# Patient Record
Sex: Female | Born: 1950 | Race: Black or African American | Hispanic: No | Marital: Married | Smoking: Never smoker
Health system: Southern US, Community
[De-identification: ages and names within clinical notes are randomized; demographics above are authoritative.]

---

## 2003-03-14 ENCOUNTER — Encounter: Payer: Self-pay | Admitting: Emergency Medicine

## 2003-03-14 ENCOUNTER — Emergency Department (HOSPITAL_COMMUNITY): Admission: EM | Admit: 2003-03-14 | Discharge: 2003-03-14 | Payer: Self-pay | Admitting: Emergency Medicine

## 2003-03-25 ENCOUNTER — Encounter: Payer: Self-pay | Admitting: Orthopedic Surgery

## 2003-03-25 ENCOUNTER — Encounter: Admission: RE | Admit: 2003-03-25 | Discharge: 2003-03-25 | Payer: Self-pay | Admitting: Orthopedic Surgery

## 2003-06-19 ENCOUNTER — Encounter: Admission: RE | Admit: 2003-06-19 | Discharge: 2003-06-19 | Payer: Self-pay | Admitting: Orthopedic Surgery

## 2005-08-12 ENCOUNTER — Encounter: Admission: RE | Admit: 2005-08-12 | Discharge: 2005-08-12 | Payer: Self-pay | Admitting: Obstetrics and Gynecology

## 2006-05-02 ENCOUNTER — Emergency Department (HOSPITAL_COMMUNITY): Admission: EM | Admit: 2006-05-02 | Discharge: 2006-05-02 | Payer: Self-pay | Admitting: Emergency Medicine

## 2007-02-28 ENCOUNTER — Observation Stay (HOSPITAL_COMMUNITY): Admission: EM | Admit: 2007-02-28 | Discharge: 2007-03-01 | Payer: Self-pay | Admitting: Emergency Medicine

## 2009-01-22 ENCOUNTER — Encounter: Admission: RE | Admit: 2009-01-22 | Discharge: 2009-01-22 | Payer: Self-pay | Admitting: Obstetrics and Gynecology

## 2010-07-13 ENCOUNTER — Encounter: Payer: Self-pay | Admitting: Obstetrics and Gynecology

## 2010-11-03 NOTE — H&P (Signed)
Valerie Miller, Valerie Miller                ACCOUNT NO.:  [id number]   MEDICAL RECORD NO.:  [id number]          PATIENT TYPE:  EMS   LOCATION:  MAJO                         FACILITY:  [facility]   PHYSICIAN:  Michelene Gardener, MD    DATE OF BIRTH:  [birth year]   DATE OF ADMISSION:  02/28/2007  DATE OF DISCHARGE:                              HISTORY & PHYSICAL   PRIMARY PHYSICIAN:  Deirdre Peer. Polite, M.D.   CHIEF COMPLAINT:  Chest pressure.   HISTORY OF PRESENT ILLNESS:  This is a 60 year old African American  female with no significant past medical history, who presented with the  above-mentioned complaints.  The patient saw her doctor, Dr. Renford Dills, on September 5 for a check-up.  At that time she was also  complaining of some palpitations and fatigability.  EKG was done by Dr.  Nehemiah Settle at that time and it showed some abnormalities and the patient was  recommended and scheduled for a stress test on September 10.  Starting  last Sunday she started feeling more fatigued with loss of energy and  she had chest pressure on and off.  Yesterday she was feeling very  fatigued.  Today the chest pressure has become more frequent and she  became more fatigued, so she decided to come to the ER.  Described her  pressure as pressure-like, 2 out of 3, radiating to her back, and there  is no radiation to the neck, no radiation to her shoulder or left arm.  Had some nausea but there is no vomiting and she had some sweating,  which she thinks might be just hot flashes.  In the ER her EKG showed  some abnormalities and the hospitalist service was called for further  evaluation.   PAST MEDICAL HISTORY:  1. Arthritis.  2. Chronic back pain.   PAST SURGICAL HISTORY:  Back surgery.   ALLERGIES:  CLINDAMYCIN, ERYTHROMYCIN, KEFLEX, LEVAQUIN, OXYCODONE,  VICODIN.   CURRENT MEDICATIONS:  1. Aspirin 81 mg p.o. once daily.  2. Multivitamin one tablet p.o. once daily.  3. Vitamin D 1000 mg p.o. once daily.  4.  Calcium twice daily.  5. Atenolol that was prescribed by her primary care physician the      patient was never started on the atenolol.   SOCIAL HISTORY:  Denies smoking.  She only tried smoking while she was  in college but not smoking.  Drinks alcohol occasionally.  No history of  recreational drugs.   FAMILY HISTORY:  The patient is adopted and does not know too much about  her family history.   REVIEW OF SYSTEMS:  As per HPI.   PHYSICAL EXAMINATION:  VITAL SIGNS:  Temperature is 97.2, blood pressure  is 127/66, pulse 94, respiratory rate 16.  GENERAL APPEARANCE:  This is a middle-aged [place] American female in no  acute distress.  HEENT:  Conjunctivae showed no pallor, no erythema.  Pupils equal,  round, and reactive to light and accommodation.  There is no ptosis.  Hearing is intact.  There is no ear discharge, no infection.  There is  no mouth  infection or bleeding.  Oral mucosa is dry.  No pharyngeal  erythema.  NECK:  Supple.  No JVD.  No carotid bruit.  No lymphadenopathy.  No  thyroid enlargement or thyroid tenderness.  CARDIOVASCULAR:  S1 and S2 are regular.  There are no murmurs, no  gallops, and no thrills.  RESPIRATORY:  The patient is breathing between 16 to 18.  There is no  use of accessory muscles, and there are no rales, no rhonchi and no  wheezes noted.  ABDOMEN:  Soft, nondistended, nontender, no hepatosplenomegaly.  Bowel  sounds are normal.  The umbilicus is central.  LOWER EXTREMITIES:  No edema, no rashes, no varicose veins.  SKIN:  No rash and no erythema.  NEUROLOGIC:  Cranial nerves intact II-XII.   LAB RESULTS:  Sodium 137, potassium 4.5, chloride 106, bicarb 26,  glucose 102, BUN 13, creatinine is 0.9.  WBC 4.4, hemoglobin 13.7,  hematocrit 39.8, MCV 83.5.  EKG was not available at this time but as  per the ER attending, there is some nonspecific T-wave inversion.  We  will try to locate the EKG.   IMPRESSION AND ASSESSMENT:  1. Chest pain.   Will admit this patient to telemetry.  I will get      three sets of troponin and cardiac enzymes and I will follow serial      EKGs.  Will start this patient on aspirin and metoprolol and      nitroglycerin as needed.  I will also start her on Lovenox.  Will      get a cardiology evaluation.  This patient actually is supposed to      get a stress test tomorrow so we will get evaluation in the      hospital to proceed with stress test.  2. Chronic back pain.  Will continue ibuprofen as needed.   TOTAL ASSESSMENT TIME:  45 minutes.      Michelene Gardener, MD  Electronically Signed     NAE/MEDQ  D:  02/28/2007  T:  02/28/2007  Job:  [id number]   cc:   Deirdre Peer. Polite, M.D.

## 2010-11-03 NOTE — H&P (Signed)
NAMELATONDRA, Valerie Miller                ACCOUNT NO.:  [id number]   MEDICAL RECORD NO.:  [id number]          PATIENT TYPE:  INP   LOCATION:  1824                         FACILITY:  [facility]   PHYSICIAN:  Michelene Gardener, MD    DATE OF BIRTH:  1950-12-22   DATE OF ADMISSION:  02/28/2007  DATE OF DISCHARGE:                              HISTORY & PHYSICAL   Primary physician is   Sorry, this is a wrong dictation.  Thank you.      Michelene Gardener, MD  Electronically Signed     NAE/MEDQ  D:  02/28/2007  T:  02/28/2007  Job:  [phone]

## 2010-11-26 ENCOUNTER — Other Ambulatory Visit: Payer: Self-pay | Admitting: Obstetrics and Gynecology

## 2010-11-26 ENCOUNTER — Other Ambulatory Visit (HOSPITAL_COMMUNITY)
Admission: RE | Admit: 2010-11-26 | Discharge: 2010-11-26 | Disposition: A | Discharge status: Home / Self Care | Payer: BC Managed Care – PPO | Source: Ambulatory Visit | Attending: Obstetrics and Gynecology | Admitting: Obstetrics and Gynecology

## 2010-11-26 DIAGNOSIS — Z01419 Encounter for gynecological examination (general) (routine) without abnormal findings: Secondary | ICD-10-CM | POA: Insufficient documentation

## 2010-11-26 DIAGNOSIS — Z1231 Encounter for screening mammogram for malignant neoplasm of breast: Secondary | ICD-10-CM

## 2010-12-04 ENCOUNTER — Ambulatory Visit
Admission: RE | Admit: 2010-12-04 | Discharge: 2010-12-04 | Disposition: A | Discharge status: Home / Self Care | Payer: BC Managed Care – PPO | Source: Ambulatory Visit | Attending: Obstetrics and Gynecology | Admitting: Obstetrics and Gynecology

## 2010-12-04 DIAGNOSIS — Z1231 Encounter for screening mammogram for malignant neoplasm of breast: Secondary | ICD-10-CM

## 2011-04-02 LAB — CBC
HCT: 39.8
Platelets: 261
WBC: 4.4

## 2011-04-02 LAB — TSH: TSH: 0.649

## 2011-04-02 LAB — I-STAT 8, (EC8 V) (CONVERTED LAB)
Acid-Base Excess: 1
BUN: 13
Chloride: 106
HCT: 42
Hemoglobin: 14.3
Operator id: 234501
Potassium: 4.5
Sodium: 137

## 2011-04-02 LAB — D-DIMER, QUANTITATIVE: D-Dimer, Quant: 0.35

## 2011-04-02 LAB — CARDIAC PANEL(CRET KIN+CKTOT+MB+TROPI)
CK, MB: 1.9
Relative Index: 1.2
Troponin I: 0.01

## 2011-04-02 LAB — CK TOTAL AND CKMB (NOT AT ARMC)
CK, MB: 3.3
Relative Index: 1.6

## 2011-04-02 LAB — DIFFERENTIAL
Eosinophils Relative: 2
Lymphocytes Relative: 26
Lymphs Abs: 1.2
Neutro Abs: 2.9
Neutrophils Relative %: 65

## 2011-04-02 LAB — POCT I-STAT CREATININE
Creatinine, Ser: 0.9
Operator id: 234501

## 2014-03-22 ENCOUNTER — Other Ambulatory Visit (HOSPITAL_COMMUNITY)
Admission: RE | Admit: 2014-03-22 | Discharge: 2014-03-22 | Disposition: A | Discharge status: Home / Self Care | Payer: PRIVATE HEALTH INSURANCE | Source: Ambulatory Visit | Attending: Obstetrics and Gynecology | Admitting: Obstetrics and Gynecology

## 2014-03-22 ENCOUNTER — Other Ambulatory Visit: Payer: Self-pay | Admitting: Obstetrics and Gynecology

## 2014-03-22 DIAGNOSIS — Z1151 Encounter for screening for human papillomavirus (HPV): Secondary | ICD-10-CM | POA: Insufficient documentation

## 2014-03-22 DIAGNOSIS — Z01419 Encounter for gynecological examination (general) (routine) without abnormal findings: Secondary | ICD-10-CM | POA: Diagnosis not present

## 2014-03-25 LAB — CYTOLOGY - PAP

## 2014-05-22 ENCOUNTER — Encounter (HOSPITAL_COMMUNITY): Payer: Self-pay | Admitting: Emergency Medicine

## 2014-05-22 ENCOUNTER — Emergency Department (INDEPENDENT_AMBULATORY_CARE_PROVIDER_SITE_OTHER)
Admission: EM | Admit: 2014-05-22 | Discharge: 2014-05-22 | Disposition: A | Discharge status: Home / Self Care | Payer: PRIVATE HEALTH INSURANCE | Source: Home / Self Care | Attending: Emergency Medicine | Admitting: Emergency Medicine

## 2014-05-22 DIAGNOSIS — R0982 Postnasal drip: Secondary | ICD-10-CM

## 2014-05-22 DIAGNOSIS — R05 Cough: Secondary | ICD-10-CM

## 2014-05-22 DIAGNOSIS — R059 Cough, unspecified: Secondary | ICD-10-CM

## 2014-05-22 DIAGNOSIS — J069 Acute upper respiratory infection, unspecified: Secondary | ICD-10-CM

## 2014-05-22 NOTE — ED Provider Notes (Signed)
CSN: [id number]     Arrival date & time 05/22/14  5852 History   First MD Initiated Contact with Patient 05/22/14 [phone]     Chief Complaint  Patient presents with  . URI   (Consider location/radiation/quality/duration/timing/severity/associated sxs/prior Treatment) HPI Comments: URI symptoms for approximately 3 weeks. She was given a prescription for Ceftinir earlier this month for her sinus congestion. She completed that course. She is complaining primarily of persistent cough and PND. She denies fever or shortness of breath.   History reviewed. No pertinent past medical history. History reviewed. No pertinent past surgical history. No family history on file. History  Substance Use Topics  . Smoking status: Never Smoker   . Smokeless tobacco: Not on file  . Alcohol Use: No   OB History    No data available     Review of Systems  Constitutional: Positive for fatigue. Negative for fever, chills, activity change and appetite change.  HENT: Positive for congestion, postnasal drip and rhinorrhea. Negative for facial swelling and sore throat.   Eyes: Negative.   Respiratory: Positive for cough. Negative for shortness of breath.   Cardiovascular: Negative.   Musculoskeletal: Negative for neck pain and neck stiffness.  Skin: Negative for pallor and rash.  Neurological: Negative.     Allergies  Erythromycin and Levaquin  Home Medications   Prior to Admission medications   Not on File   BP 102/71 mmHg  Pulse 75  Temp(Src) 97.3 F (36.3 C) (Oral)  Resp 18  SpO2 96% Physical Exam  Constitutional: She is oriented to person, place, and time. She appears well-developed and well-nourished. No distress.  HENT:  Mouth/Throat: No oropharyngeal exudate.  Bilateral TMs are normal Oropharynx with minor erythema and scant clear PND. Clearing throat frequently  Eyes: Conjunctivae and EOM are normal.  Neck: Normal range of motion. Neck supple.  Cardiovascular: Normal rate, regular  rhythm and normal heart sounds.   Pulmonary/Chest: Effort normal and breath sounds normal. No respiratory distress. She has no wheezes. She has no rales.  Lymphadenopathy:    She has no cervical adenopathy.  Neurological: She is alert and oriented to person, place, and time.  Skin: Skin is warm and dry.  Psychiatric: She has a normal mood and affect.  Nursing note and vitals reviewed.   ED Course  Procedures (including critical care time) Labs Review Labs Reviewed - No data to display  Imaging Review No results found.   MDM   1. URI (upper respiratory infection)   2. PND (post-nasal drip)   3. Cough    Allegra 180 mg a day And , Sudafed PE 10 mg every 4 hours prn congestion Stop the claritin D Lots of fluids    Janne Napoleon, NP 05/22/14 [phone]

## 2014-05-22 NOTE — ED Notes (Signed)
C/o cold sx onset 3 weeks Sx include productive cough, wheezing, HA, facial pressure, PND Denies fevers, chills, v/n/d Reports she finished Cefdinir for Sinus inf on 11/28 Alert, no signs of acute distress.

## 2014-05-22 NOTE — Discharge Instructions (Signed)
Cough, Adult Allegra 180 mg a day And , Sudafed PE 10 mg every 4 hours prn congestion  A cough is a reflex that helps clear your throat and airways. It can help heal the body or may be a reaction to an irritated airway. A cough may only last 2 or 3 weeks (acute) or may last more than 8 weeks (chronic).  CAUSES Acute cough:  Viral or bacterial infections. Chronic cough:  Infections.  Allergies.  Asthma.  Post-nasal drip.  Smoking.  Heartburn or acid reflux.  Some medicines.  Chronic lung problems (COPD).  Cancer. SYMPTOMS   Cough.  Fever.  Chest pain.  Increased breathing rate.  High-pitched whistling sound when breathing (wheezing).  Colored mucus that you cough up (sputum). TREATMENT   A bacterial cough may be treated with antibiotic medicine.  A viral cough must run its course and will not respond to antibiotics.  Your caregiver may recommend other treatments if you have a chronic cough. HOME CARE INSTRUCTIONS   Only take over-the-counter or prescription medicines for pain, discomfort, or fever as directed by your caregiver. Use cough suppressants only as directed by your caregiver.  Use a cold steam vaporizer or humidifier in your bedroom or home to help loosen secretions.  Sleep in a semi-upright position if your cough is worse at night.  Rest as needed.  Stop smoking if you smoke. SEEK IMMEDIATE MEDICAL CARE IF:   You have pus in your sputum.  Your cough starts to worsen.  You cannot control your cough with suppressants and are losing sleep.  You begin coughing up blood.  You have difficulty breathing.  You develop pain which is getting worse or is uncontrolled with medicine.  You have a fever. MAKE SURE YOU:   Understand these instructions.  Will watch your condition.  Will get help right away if you are not doing well or get worse. Document Released: 12/04/2010 Document Revised: 08/30/2011 Document Reviewed: 12/04/2010 [facility]  Patient Information 2015 [place], [place]. This information is not intended to replace advice given to you by your health care provider. Make sure you discuss any questions you have with your health care provider.  Upper Respiratory Infection, Adult An upper respiratory infection (URI) is also sometimes known as the common cold. The upper respiratory tract includes the nose, sinuses, throat, trachea, and bronchi. Bronchi are the airways leading to the lungs. Most people improve within 1 week, but symptoms can last up to 2 weeks. A residual cough may last even longer.  CAUSES Many different viruses can infect the tissues lining the upper respiratory tract. The tissues become irritated and inflamed and often become very moist. Mucus production is also common. A cold is contagious. You can easily spread the virus to others by oral contact. This includes kissing, sharing a glass, coughing, or sneezing. Touching your mouth or nose and then touching a surface, which is then touched by another person, can also spread the virus. SYMPTOMS  Symptoms typically develop 1 to 3 days after you come in contact with a cold virus. Symptoms vary from person to person. They may include:  Runny nose.  Sneezing.  Nasal congestion.  Sinus irritation.  Sore throat.  Loss of voice (laryngitis).  Cough.  Fatigue.  Muscle aches.  Loss of appetite.  Headache.  Low-grade fever. DIAGNOSIS  You might diagnose your own cold based on familiar symptoms, since most people get a cold 2 to 3 times a year. Your caregiver can confirm this based on your  exam. Most importantly, your caregiver can check that your symptoms are not due to another disease such as strep throat, sinusitis, pneumonia, asthma, or epiglottitis. Blood tests, throat tests, and X-rays are not necessary to diagnose a common cold, but they may sometimes be helpful in excluding other more serious diseases. Your caregiver will decide if any further tests are  required. RISKS AND COMPLICATIONS  You may be at risk for a more severe case of the common cold if you smoke cigarettes, have chronic heart disease (such as heart failure) or lung disease (such as asthma), or if you have a weakened immune system. The very young and very old are also at risk for more serious infections. Bacterial sinusitis, middle ear infections, and bacterial pneumonia can complicate the common cold. The common cold can worsen asthma and chronic obstructive pulmonary disease (COPD). Sometimes, these complications can require emergency medical care and may be life-threatening. PREVENTION  The best way to protect against getting a cold is to practice good hygiene. Avoid oral or hand contact with people with cold symptoms. Wash your hands often if contact occurs. There is no clear evidence that vitamin C, vitamin E, echinacea, or exercise reduces the chance of developing a cold. However, it is always recommended to get plenty of rest and practice good nutrition. TREATMENT  Treatment is directed at relieving symptoms. There is no cure. Antibiotics are not effective, because the infection is caused by a virus, not by bacteria. Treatment may include:  Increased fluid intake. Sports drinks offer valuable electrolytes, sugars, and fluids.  Breathing heated mist or steam (vaporizer or shower).  Eating chicken soup or other clear broths, and maintaining good nutrition.  Getting plenty of rest.  Using gargles or lozenges for comfort.  Controlling fevers with ibuprofen or acetaminophen as directed by your caregiver.  Increasing usage of your inhaler if you have asthma. Zinc gel and zinc lozenges, taken in the first 24 hours of the common cold, can shorten the duration and lessen the severity of symptoms. Pain medicines may help with fever, muscle aches, and throat pain. A variety of non-prescription medicines are available to treat congestion and runny nose. Your caregiver can make  recommendations and may suggest nasal or lung inhalers for other symptoms.  HOME CARE INSTRUCTIONS   Only take over-the-counter or prescription medicines for pain, discomfort, or fever as directed by your caregiver.  Use a warm mist humidifier or inhale steam from a shower to increase air moisture. This may keep secretions moist and make it easier to breathe.  Drink enough water and fluids to keep your urine clear or pale yellow.  Rest as needed.  Return to work when your temperature has returned to normal or as your caregiver advises. You may need to stay home longer to avoid infecting others. You can also use a face mask and careful hand washing to prevent spread of the virus. SEEK MEDICAL CARE IF:   After the first few days, you feel you are getting worse rather than better.  You need your caregiver's advice about medicines to control symptoms.  You develop chills, worsening shortness of breath, or brown or red sputum. These may be signs of pneumonia.  You develop yellow or brown nasal discharge or pain in the face, especially when you bend forward. These may be signs of sinusitis.  You develop a fever, swollen neck glands, pain with swallowing, or white areas in the back of your throat. These may be signs of strep throat. SEEK IMMEDIATE  MEDICAL CARE IF:   You have a fever.  You develop severe or persistent headache, ear pain, sinus pain, or chest pain.  You develop wheezing, a prolonged cough, cough up blood, or have a change in your usual mucus (if you have chronic lung disease).  You develop sore muscles or a stiff neck. Document Released: 12/01/2000 Document Revised: 08/30/2011 Document Reviewed: 09/12/2013 [facility] Patient Information 2015 [place], [place]. This information is not intended to replace advice given to you by your health care provider. Make sure you discuss any questions you have with your health care provider.

## 2016-06-29 DIAGNOSIS — N952 Postmenopausal atrophic vaginitis: Secondary | ICD-10-CM | POA: Diagnosis not present

## 2016-06-29 DIAGNOSIS — N941 Unspecified dyspareunia: Secondary | ICD-10-CM | POA: Diagnosis not present

## 2016-06-29 DIAGNOSIS — Z113 Encounter for screening for infections with a predominantly sexual mode of transmission: Secondary | ICD-10-CM | POA: Diagnosis not present

## 2016-06-29 DIAGNOSIS — Z01419 Encounter for gynecological examination (general) (routine) without abnormal findings: Secondary | ICD-10-CM | POA: Diagnosis not present

## 2016-06-29 DIAGNOSIS — Z124 Encounter for screening for malignant neoplasm of cervix: Secondary | ICD-10-CM | POA: Diagnosis not present

## 2016-10-29 DIAGNOSIS — J309 Allergic rhinitis, unspecified: Secondary | ICD-10-CM | POA: Diagnosis not present

## 2016-10-29 DIAGNOSIS — E559 Vitamin D deficiency, unspecified: Secondary | ICD-10-CM | POA: Diagnosis not present

## 2016-10-29 DIAGNOSIS — Z0000 Encounter for general adult medical examination without abnormal findings: Secondary | ICD-10-CM | POA: Diagnosis not present

## 2016-11-19 DIAGNOSIS — J019 Acute sinusitis, unspecified: Secondary | ICD-10-CM | POA: Diagnosis not present

## 2016-12-28 DIAGNOSIS — Z1211 Encounter for screening for malignant neoplasm of colon: Secondary | ICD-10-CM | POA: Diagnosis not present

## 2017-02-02 DIAGNOSIS — J019 Acute sinusitis, unspecified: Secondary | ICD-10-CM | POA: Diagnosis not present

## 2017-02-02 DIAGNOSIS — N760 Acute vaginitis: Secondary | ICD-10-CM | POA: Diagnosis not present

## 2017-02-14 DIAGNOSIS — R05 Cough: Secondary | ICD-10-CM | POA: Diagnosis not present

## 2017-02-14 DIAGNOSIS — N760 Acute vaginitis: Secondary | ICD-10-CM | POA: Diagnosis not present

## 2017-02-14 DIAGNOSIS — R030 Elevated blood-pressure reading, without diagnosis of hypertension: Secondary | ICD-10-CM | POA: Diagnosis not present

## 2017-02-14 DIAGNOSIS — J209 Acute bronchitis, unspecified: Secondary | ICD-10-CM | POA: Diagnosis not present

## 2017-04-04 DIAGNOSIS — M26623 Arthralgia of bilateral temporomandibular joint: Secondary | ICD-10-CM | POA: Diagnosis not present

## 2017-04-04 DIAGNOSIS — K219 Gastro-esophageal reflux disease without esophagitis: Secondary | ICD-10-CM | POA: Diagnosis not present

## 2017-04-04 DIAGNOSIS — J329 Chronic sinusitis, unspecified: Secondary | ICD-10-CM | POA: Diagnosis not present

## 2017-06-06 DIAGNOSIS — N760 Acute vaginitis: Secondary | ICD-10-CM | POA: Diagnosis not present

## 2017-06-06 DIAGNOSIS — J019 Acute sinusitis, unspecified: Secondary | ICD-10-CM | POA: Diagnosis not present

## 2017-06-25 DIAGNOSIS — R030 Elevated blood-pressure reading, without diagnosis of hypertension: Secondary | ICD-10-CM | POA: Diagnosis not present

## 2017-06-25 DIAGNOSIS — J019 Acute sinusitis, unspecified: Secondary | ICD-10-CM | POA: Diagnosis not present

## 2017-10-10 DIAGNOSIS — H35363 Drusen (degenerative) of macula, bilateral: Secondary | ICD-10-CM | POA: Diagnosis not present

## 2017-10-10 DIAGNOSIS — H25013 Cortical age-related cataract, bilateral: Secondary | ICD-10-CM | POA: Diagnosis not present

## 2017-10-10 DIAGNOSIS — H40013 Open angle with borderline findings, low risk, bilateral: Secondary | ICD-10-CM | POA: Diagnosis not present

## 2017-10-10 DIAGNOSIS — H2513 Age-related nuclear cataract, bilateral: Secondary | ICD-10-CM | POA: Diagnosis not present

## 2017-12-01 DIAGNOSIS — Z1389 Encounter for screening for other disorder: Secondary | ICD-10-CM | POA: Diagnosis not present

## 2017-12-01 DIAGNOSIS — J329 Chronic sinusitis, unspecified: Secondary | ICD-10-CM | POA: Diagnosis not present

## 2017-12-01 DIAGNOSIS — N6321 Unspecified lump in the left breast, upper outer quadrant: Secondary | ICD-10-CM | POA: Diagnosis not present

## 2017-12-01 DIAGNOSIS — R5383 Other fatigue: Secondary | ICD-10-CM | POA: Diagnosis not present

## 2017-12-01 DIAGNOSIS — R7989 Other specified abnormal findings of blood chemistry: Secondary | ICD-10-CM | POA: Diagnosis not present

## 2017-12-01 DIAGNOSIS — M545 Low back pain: Secondary | ICD-10-CM | POA: Diagnosis not present

## 2017-12-01 DIAGNOSIS — E559 Vitamin D deficiency, unspecified: Secondary | ICD-10-CM | POA: Diagnosis not present

## 2017-12-01 DIAGNOSIS — Z0000 Encounter for general adult medical examination without abnormal findings: Secondary | ICD-10-CM | POA: Diagnosis not present

## 2017-12-02 DIAGNOSIS — R922 Inconclusive mammogram: Secondary | ICD-10-CM | POA: Diagnosis not present

## 2017-12-02 DIAGNOSIS — N6012 Diffuse cystic mastopathy of left breast: Secondary | ICD-10-CM | POA: Diagnosis not present

## 2017-12-07 ENCOUNTER — Other Ambulatory Visit: Payer: Self-pay | Admitting: Radiology

## 2017-12-07 DIAGNOSIS — D0592 Unspecified type of carcinoma in situ of left breast: Secondary | ICD-10-CM | POA: Diagnosis not present

## 2017-12-07 DIAGNOSIS — C50912 Malignant neoplasm of unspecified site of left female breast: Secondary | ICD-10-CM | POA: Diagnosis not present

## 2017-12-07 DIAGNOSIS — D592 Drug-induced nonautoimmune hemolytic anemia: Secondary | ICD-10-CM | POA: Diagnosis not present

## 2017-12-07 DIAGNOSIS — N6322 Unspecified lump in the left breast, upper inner quadrant: Secondary | ICD-10-CM | POA: Diagnosis not present

## 2017-12-07 DIAGNOSIS — C50212 Malignant neoplasm of upper-inner quadrant of left female breast: Secondary | ICD-10-CM | POA: Diagnosis not present

## 2017-12-20 DIAGNOSIS — N6322 Unspecified lump in the left breast, upper inner quadrant: Secondary | ICD-10-CM | POA: Diagnosis not present

## 2017-12-21 DIAGNOSIS — C50912 Malignant neoplasm of unspecified site of left female breast: Secondary | ICD-10-CM | POA: Diagnosis not present

## 2017-12-21 DIAGNOSIS — Z6827 Body mass index (BMI) 27.0-27.9, adult: Secondary | ICD-10-CM | POA: Diagnosis not present

## 2017-12-21 DIAGNOSIS — C50212 Malignant neoplasm of upper-inner quadrant of left female breast: Secondary | ICD-10-CM | POA: Diagnosis not present

## 2017-12-21 DIAGNOSIS — Z170 Estrogen receptor positive status [ER+]: Secondary | ICD-10-CM | POA: Diagnosis not present

## 2018-01-04 DIAGNOSIS — C50212 Malignant neoplasm of upper-inner quadrant of left female breast: Secondary | ICD-10-CM | POA: Diagnosis not present

## 2018-01-04 DIAGNOSIS — Z170 Estrogen receptor positive status [ER+]: Secondary | ICD-10-CM | POA: Diagnosis not present

## 2018-01-05 DIAGNOSIS — Z87891 Personal history of nicotine dependence: Secondary | ICD-10-CM | POA: Diagnosis not present

## 2018-01-05 DIAGNOSIS — Z170 Estrogen receptor positive status [ER+]: Secondary | ICD-10-CM | POA: Diagnosis not present

## 2018-01-05 DIAGNOSIS — C50912 Malignant neoplasm of unspecified site of left female breast: Secondary | ICD-10-CM | POA: Diagnosis not present

## 2018-01-05 DIAGNOSIS — C50212 Malignant neoplasm of upper-inner quadrant of left female breast: Secondary | ICD-10-CM | POA: Diagnosis not present

## 2018-01-05 DIAGNOSIS — Z91018 Allergy to other foods: Secondary | ICD-10-CM | POA: Diagnosis not present

## 2018-01-07 DIAGNOSIS — J029 Acute pharyngitis, unspecified: Secondary | ICD-10-CM | POA: Diagnosis not present

## 2018-01-07 DIAGNOSIS — R21 Rash and other nonspecific skin eruption: Secondary | ICD-10-CM | POA: Diagnosis not present

## 2018-01-16 DIAGNOSIS — C50212 Malignant neoplasm of upper-inner quadrant of left female breast: Secondary | ICD-10-CM | POA: Diagnosis not present

## 2018-01-16 DIAGNOSIS — Z170 Estrogen receptor positive status [ER+]: Secondary | ICD-10-CM | POA: Diagnosis not present

## 2018-01-16 DIAGNOSIS — Z6825 Body mass index (BMI) 25.0-25.9, adult: Secondary | ICD-10-CM | POA: Diagnosis not present

## 2018-01-27 DIAGNOSIS — H16223 Keratoconjunctivitis sicca, not specified as Sjogren's, bilateral: Secondary | ICD-10-CM | POA: Diagnosis not present

## 2018-01-27 DIAGNOSIS — H04123 Dry eye syndrome of bilateral lacrimal glands: Secondary | ICD-10-CM | POA: Diagnosis not present

## 2018-01-27 DIAGNOSIS — H1013 Acute atopic conjunctivitis, bilateral: Secondary | ICD-10-CM | POA: Diagnosis not present

## 2018-01-27 DIAGNOSIS — H40013 Open angle with borderline findings, low risk, bilateral: Secondary | ICD-10-CM | POA: Diagnosis not present

## 2018-01-30 DIAGNOSIS — C50212 Malignant neoplasm of upper-inner quadrant of left female breast: Secondary | ICD-10-CM | POA: Diagnosis not present

## 2018-01-30 DIAGNOSIS — Z170 Estrogen receptor positive status [ER+]: Secondary | ICD-10-CM | POA: Diagnosis not present

## 2018-02-07 DIAGNOSIS — Z170 Estrogen receptor positive status [ER+]: Secondary | ICD-10-CM | POA: Diagnosis not present

## 2018-02-07 DIAGNOSIS — C50912 Malignant neoplasm of unspecified site of left female breast: Secondary | ICD-10-CM | POA: Diagnosis not present

## 2018-02-07 DIAGNOSIS — C50212 Malignant neoplasm of upper-inner quadrant of left female breast: Secondary | ICD-10-CM | POA: Diagnosis not present

## 2018-02-24 DIAGNOSIS — M1812 Unilateral primary osteoarthritis of first carpometacarpal joint, left hand: Secondary | ICD-10-CM | POA: Diagnosis not present

## 2018-02-24 DIAGNOSIS — M654 Radial styloid tenosynovitis [de Quervain]: Secondary | ICD-10-CM | POA: Diagnosis not present

## 2018-03-06 DIAGNOSIS — Z6825 Body mass index (BMI) 25.0-25.9, adult: Secondary | ICD-10-CM | POA: Diagnosis not present

## 2018-03-06 DIAGNOSIS — M8580 Other specified disorders of bone density and structure, unspecified site: Secondary | ICD-10-CM | POA: Diagnosis not present

## 2018-03-06 DIAGNOSIS — M899 Disorder of bone, unspecified: Secondary | ICD-10-CM | POA: Diagnosis not present

## 2018-05-11 ENCOUNTER — Other Ambulatory Visit: Payer: Self-pay | Admitting: Internal Medicine

## 2018-05-11 ENCOUNTER — Ambulatory Visit
Admission: RE | Admit: 2018-05-11 | Discharge: 2018-05-11 | Disposition: A | Discharge status: Home / Self Care | Payer: PRIVATE HEALTH INSURANCE | Source: Ambulatory Visit | Attending: Internal Medicine | Admitting: Internal Medicine

## 2018-05-11 DIAGNOSIS — R059 Cough, unspecified: Secondary | ICD-10-CM

## 2018-05-11 DIAGNOSIS — R05 Cough: Secondary | ICD-10-CM

## 2018-06-08 DIAGNOSIS — C50919 Malignant neoplasm of unspecified site of unspecified female breast: Secondary | ICD-10-CM | POA: Diagnosis not present

## 2018-06-08 DIAGNOSIS — R7309 Other abnormal glucose: Secondary | ICD-10-CM | POA: Diagnosis not present

## 2018-06-08 DIAGNOSIS — E559 Vitamin D deficiency, unspecified: Secondary | ICD-10-CM | POA: Diagnosis not present

## 2018-06-08 DIAGNOSIS — E785 Hyperlipidemia, unspecified: Secondary | ICD-10-CM | POA: Diagnosis not present

## 2018-06-26 DIAGNOSIS — C50912 Malignant neoplasm of unspecified site of left female breast: Secondary | ICD-10-CM | POA: Diagnosis not present

## 2018-06-26 DIAGNOSIS — Z6825 Body mass index (BMI) 25.0-25.9, adult: Secondary | ICD-10-CM | POA: Diagnosis not present

## 2018-06-26 DIAGNOSIS — Z170 Estrogen receptor positive status [ER+]: Secondary | ICD-10-CM | POA: Diagnosis not present

## 2018-07-17 DIAGNOSIS — Z170 Estrogen receptor positive status [ER+]: Secondary | ICD-10-CM | POA: Diagnosis not present

## 2018-07-17 DIAGNOSIS — M899 Disorder of bone, unspecified: Secondary | ICD-10-CM | POA: Diagnosis not present

## 2018-07-17 DIAGNOSIS — Z91018 Allergy to other foods: Secondary | ICD-10-CM | POA: Diagnosis not present

## 2018-07-17 DIAGNOSIS — M810 Age-related osteoporosis without current pathological fracture: Secondary | ICD-10-CM | POA: Diagnosis not present

## 2018-07-17 DIAGNOSIS — Z780 Asymptomatic menopausal state: Secondary | ICD-10-CM | POA: Diagnosis not present

## 2018-07-17 DIAGNOSIS — C50212 Malignant neoplasm of upper-inner quadrant of left female breast: Secondary | ICD-10-CM | POA: Diagnosis not present

## 2018-07-17 DIAGNOSIS — M779 Enthesopathy, unspecified: Secondary | ICD-10-CM | POA: Diagnosis not present

## 2018-07-17 DIAGNOSIS — Z6825 Body mass index (BMI) 25.0-25.9, adult: Secondary | ICD-10-CM | POA: Diagnosis not present

## 2018-07-17 DIAGNOSIS — M8580 Other specified disorders of bone density and structure, unspecified site: Secondary | ICD-10-CM | POA: Diagnosis not present

## 2018-10-17 ENCOUNTER — Telehealth: Payer: Self-pay | Admitting: General Practice

## 2018-10-17 NOTE — Telephone Encounter (Signed)
I left a message asking the patient to call me at [phone] to schedule AWV with [place]ke[place]. I also tried calling the home number, but there was no answer and no option to leave a message. VDM (DD)

## 2018-11-28 ENCOUNTER — Telehealth: Payer: Self-pay | Admitting: Internal Medicine

## 2018-11-28 NOTE — Telephone Encounter (Signed)
I called the patient to schedule her AWV (due after 6/13).  She said that she's on another call and will call us back to schedule.  She's already scheduled to see Dr. Baird Cancer in July, so I added her AWV w/ Pamala Hurry right after that appointment. VDM (DD)

## 2018-12-28 ENCOUNTER — Ambulatory Visit: Payer: Self-pay

## 2018-12-28 ENCOUNTER — Ambulatory Visit: Payer: Self-pay | Admitting: Internal Medicine

## 2019-01-01 DIAGNOSIS — Z20828 Contact with and (suspected) exposure to other viral communicable diseases: Secondary | ICD-10-CM | POA: Diagnosis not present

## 2019-01-01 DIAGNOSIS — C50919 Malignant neoplasm of unspecified site of unspecified female breast: Secondary | ICD-10-CM | POA: Diagnosis not present

## 2019-01-03 DIAGNOSIS — H43813 Vitreous degeneration, bilateral: Secondary | ICD-10-CM | POA: Diagnosis not present

## 2019-01-03 DIAGNOSIS — H40013 Open angle with borderline findings, low risk, bilateral: Secondary | ICD-10-CM | POA: Diagnosis not present

## 2019-01-03 DIAGNOSIS — H35363 Drusen (degenerative) of macula, bilateral: Secondary | ICD-10-CM | POA: Diagnosis not present

## 2019-01-03 DIAGNOSIS — H25013 Cortical age-related cataract, bilateral: Secondary | ICD-10-CM | POA: Diagnosis not present

## 2019-01-08 DIAGNOSIS — R922 Inconclusive mammogram: Secondary | ICD-10-CM | POA: Diagnosis not present

## 2019-01-08 DIAGNOSIS — Z170 Estrogen receptor positive status [ER+]: Secondary | ICD-10-CM | POA: Diagnosis not present

## 2019-01-08 DIAGNOSIS — C50212 Malignant neoplasm of upper-inner quadrant of left female breast: Secondary | ICD-10-CM | POA: Diagnosis not present

## 2019-01-08 DIAGNOSIS — Z853 Personal history of malignant neoplasm of breast: Secondary | ICD-10-CM | POA: Diagnosis not present

## 2019-01-08 DIAGNOSIS — Z6824 Body mass index (BMI) 24.0-24.9, adult: Secondary | ICD-10-CM | POA: Diagnosis not present

## 2019-01-11 ENCOUNTER — Telehealth: Payer: Self-pay | Admitting: Internal Medicine

## 2019-01-11 NOTE — Telephone Encounter (Signed)
I left a message asking the pt to call me to schedule AWV. VDM (DD)

## 2019-01-23 DIAGNOSIS — Z6825 Body mass index (BMI) 25.0-25.9, adult: Secondary | ICD-10-CM | POA: Diagnosis not present

## 2019-01-23 DIAGNOSIS — Z853 Personal history of malignant neoplasm of breast: Secondary | ICD-10-CM | POA: Diagnosis not present

## 2019-01-23 DIAGNOSIS — Z01419 Encounter for gynecological examination (general) (routine) without abnormal findings: Secondary | ICD-10-CM | POA: Diagnosis not present

## 2019-01-23 DIAGNOSIS — Z124 Encounter for screening for malignant neoplasm of cervix: Secondary | ICD-10-CM | POA: Diagnosis not present

## 2019-02-19 DIAGNOSIS — Z170 Estrogen receptor positive status [ER+]: Secondary | ICD-10-CM | POA: Diagnosis not present

## 2019-02-19 DIAGNOSIS — M810 Age-related osteoporosis without current pathological fracture: Secondary | ICD-10-CM | POA: Diagnosis not present

## 2019-02-19 DIAGNOSIS — C50212 Malignant neoplasm of upper-inner quadrant of left female breast: Secondary | ICD-10-CM | POA: Diagnosis not present

## 2019-04-17 ENCOUNTER — Telehealth: Payer: Self-pay | Admitting: General Practice

## 2019-04-17 NOTE — Telephone Encounter (Signed)
I left a message asking the patient to call and schedule an appointment.

## 2019-05-04 ENCOUNTER — Telehealth: Payer: Self-pay | Admitting: General Practice

## 2019-05-04 NOTE — Telephone Encounter (Signed)
No answer on home number. Left message on mobile number asking patient to call to schedule appointment or confirm if she's now seeing a different PCP.

## 2019-07-03 ENCOUNTER — Ambulatory Visit: Payer: PRIVATE HEALTH INSURANCE | Attending: Internal Medicine

## 2019-07-05 DIAGNOSIS — E785 Hyperlipidemia, unspecified: Secondary | ICD-10-CM | POA: Diagnosis not present

## 2019-07-05 DIAGNOSIS — C50919 Malignant neoplasm of unspecified site of unspecified female breast: Secondary | ICD-10-CM | POA: Diagnosis not present

## 2019-07-05 DIAGNOSIS — Z0000 Encounter for general adult medical examination without abnormal findings: Secondary | ICD-10-CM | POA: Diagnosis not present

## 2019-07-05 DIAGNOSIS — Z0001 Encounter for general adult medical examination with abnormal findings: Secondary | ICD-10-CM | POA: Diagnosis not present

## 2019-07-05 DIAGNOSIS — E559 Vitamin D deficiency, unspecified: Secondary | ICD-10-CM | POA: Diagnosis not present

## 2019-07-16 DIAGNOSIS — Z6826 Body mass index (BMI) 26.0-26.9, adult: Secondary | ICD-10-CM | POA: Diagnosis not present

## 2019-07-16 DIAGNOSIS — Z170 Estrogen receptor positive status [ER+]: Secondary | ICD-10-CM | POA: Diagnosis not present

## 2019-07-16 DIAGNOSIS — Z9889 Other specified postprocedural states: Secondary | ICD-10-CM | POA: Diagnosis not present

## 2019-07-16 DIAGNOSIS — Z7983 Long term (current) use of bisphosphonates: Secondary | ICD-10-CM | POA: Diagnosis not present

## 2019-07-16 DIAGNOSIS — C50212 Malignant neoplasm of upper-inner quadrant of left female breast: Secondary | ICD-10-CM | POA: Diagnosis not present

## 2019-07-16 DIAGNOSIS — Z87891 Personal history of nicotine dependence: Secondary | ICD-10-CM | POA: Diagnosis not present

## 2019-07-16 DIAGNOSIS — R928 Other abnormal and inconclusive findings on diagnostic imaging of breast: Secondary | ICD-10-CM | POA: Diagnosis not present

## 2019-07-16 DIAGNOSIS — Z79811 Long term (current) use of aromatase inhibitors: Secondary | ICD-10-CM | POA: Diagnosis not present

## 2019-07-16 DIAGNOSIS — M898X9 Other specified disorders of bone, unspecified site: Secondary | ICD-10-CM | POA: Diagnosis not present

## 2020-02-01 DIAGNOSIS — Z6826 Body mass index (BMI) 26.0-26.9, adult: Secondary | ICD-10-CM | POA: Diagnosis not present

## 2020-02-01 DIAGNOSIS — Z124 Encounter for screening for malignant neoplasm of cervix: Secondary | ICD-10-CM | POA: Diagnosis not present

## 2020-02-12 DIAGNOSIS — Z23 Encounter for immunization: Secondary | ICD-10-CM | POA: Diagnosis not present

## 2020-05-18 DIAGNOSIS — Z20822 Contact with and (suspected) exposure to covid-19: Secondary | ICD-10-CM | POA: Diagnosis not present

## 2020-07-04 IMAGING — CR DG CHEST 2V
2 series · 2 of 2 positions shown · non-contrast
Comparison: 02/28/2007

CLINICAL DATA: Cough

EXAM:
CHEST - 2 VIEW

[w chest pa]
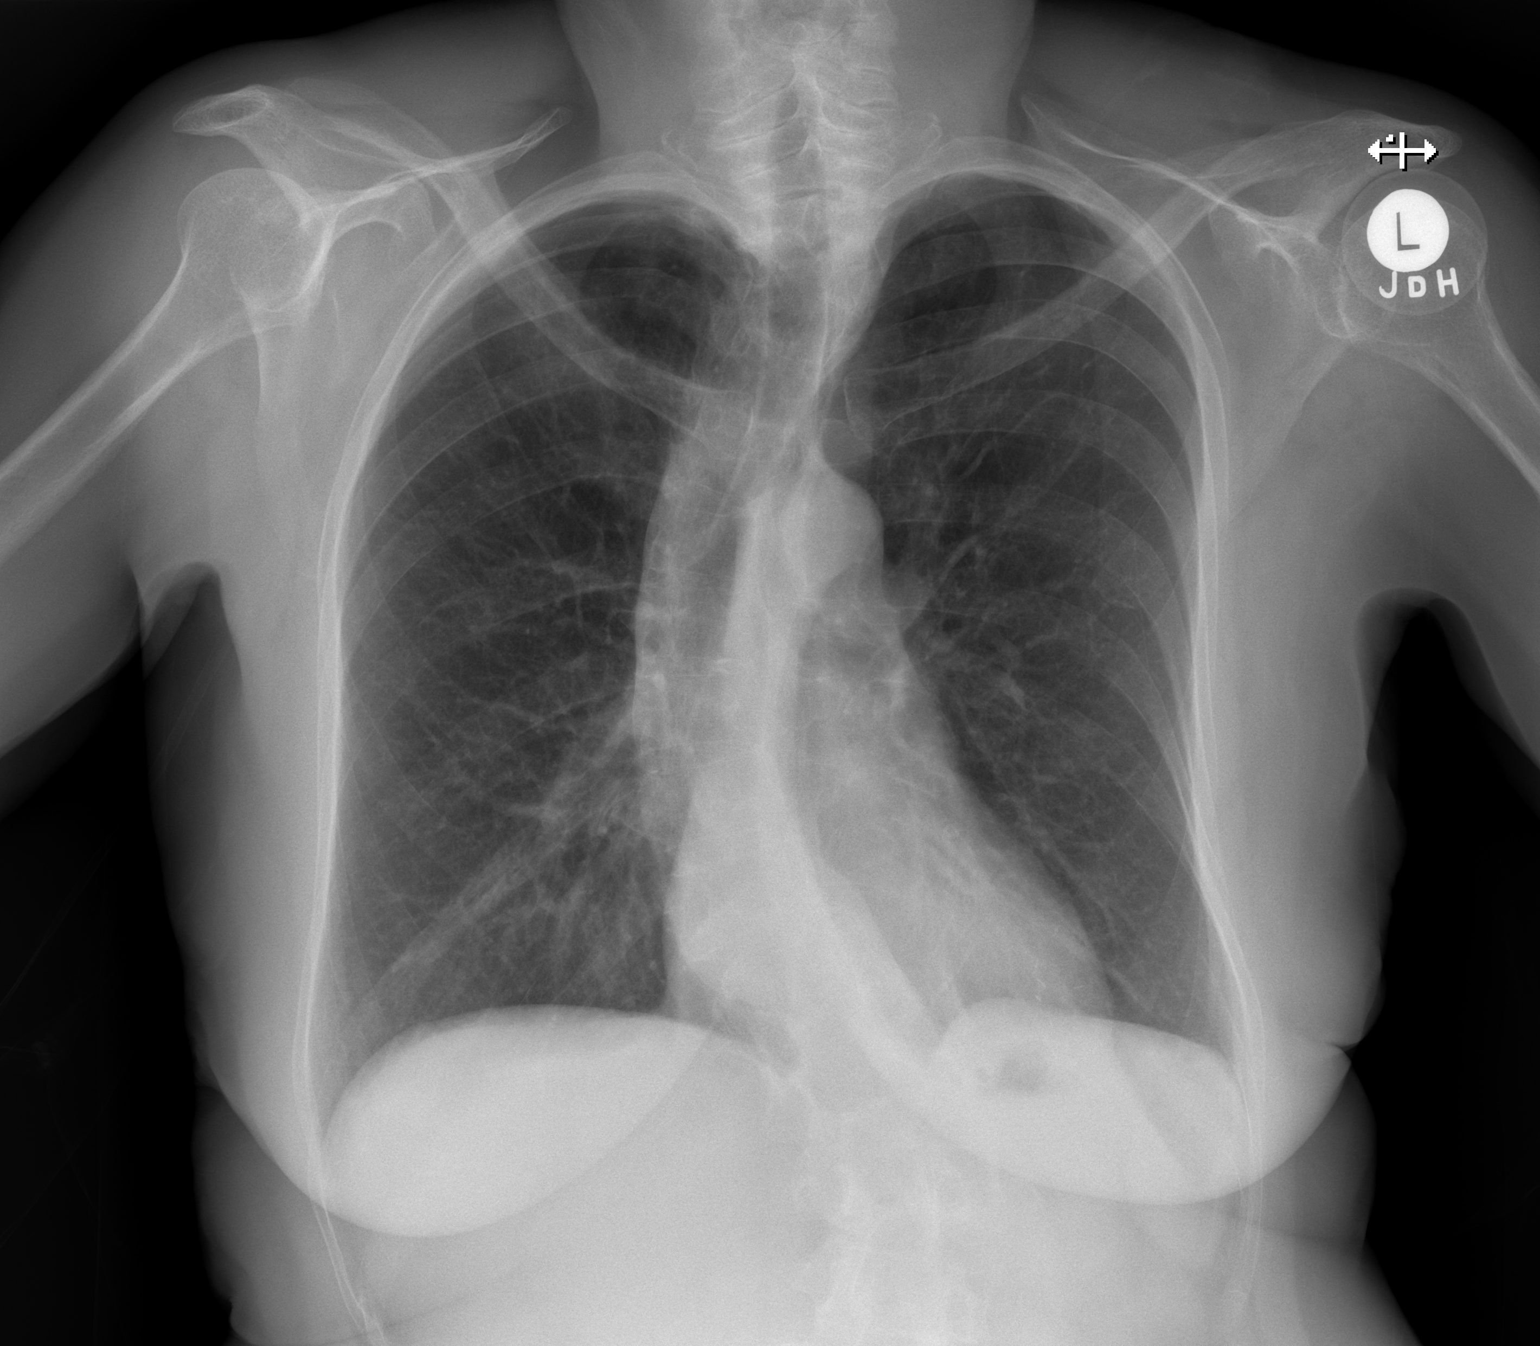

[w chest lat]
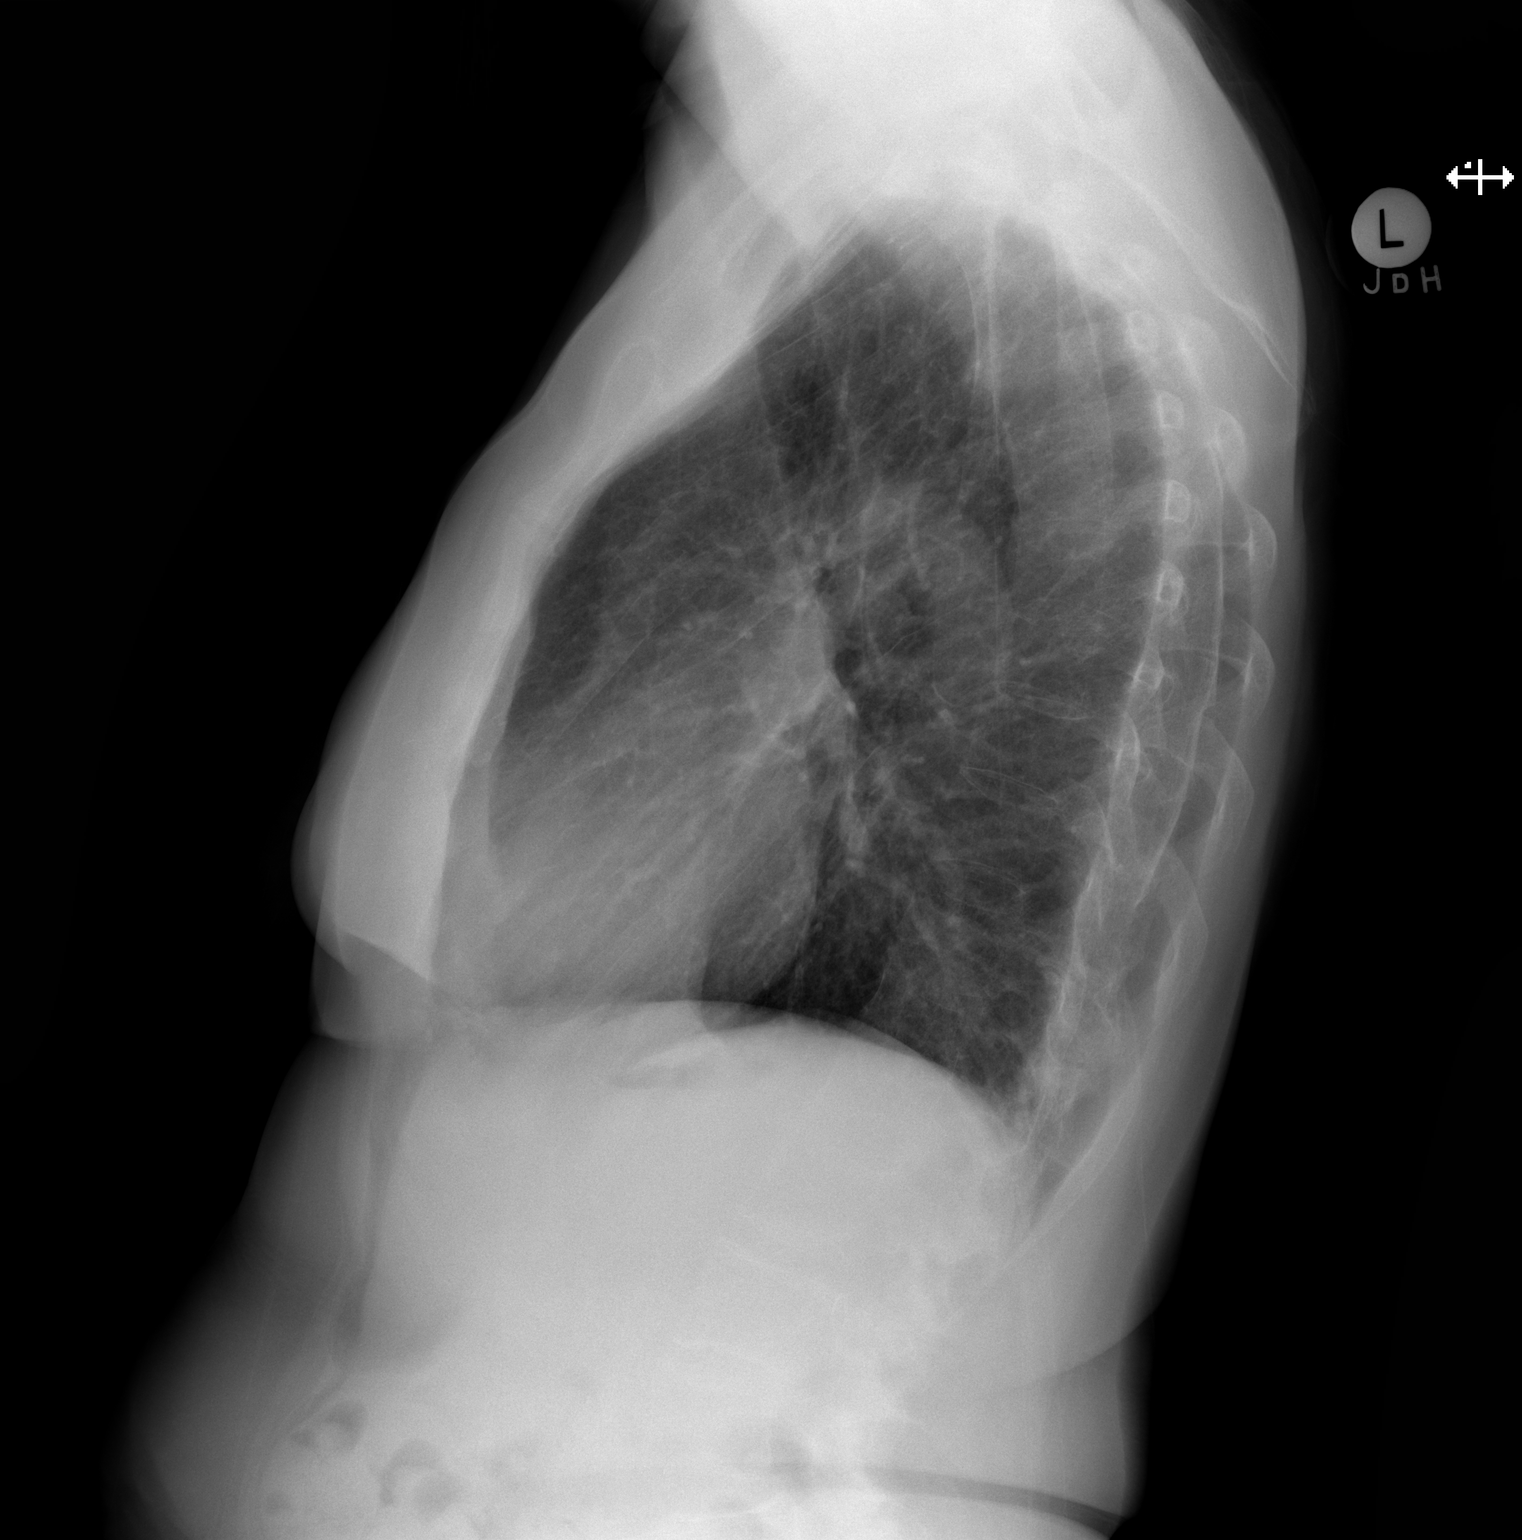

[2 of 2 positions shown; findings below may reference images not displayed]

FINDINGS: Heart and mediastinal contours are within normal limits. No focal
opacities or effusions. No acute bony abnormality. Scoliosis in the
thoracolumbar spine.
IMPRESSION: No active cardiopulmonary disease.

## 2023-05-23 NOTE — Therapy (Signed)
OUTPATIENT PHYSICAL THERAPY LOWER EXTREMITY EVALUATION   Patient Name: Valerie Miller MRN: [id number] DOB:1950/09/20, 72 y.o., female Today's Date: 05/24/2023  END OF SESSION:  PT End of Session - 05/24/23 1153     Visit Number 1    Number of Visits 5    Date for PT Re-Evaluation 07/23/23   due to delay in scheduling   Authorization Type [employer]    PT Start Time 1150    PT Stop Time 1225    PT Time Calculation (min) 35 min    Activity Tolerance Patient tolerated treatment well    Behavior During Therapy [facility] for tasks assessed/performed             History reviewed. No pertinent past medical history. History reviewed. No pertinent surgical history. There are no problems to display for this patient.   PCP: Andi Devon, MD   REFERRING PROVIDER: Arlyce Harman, MD  REFERRING DIAG: M17.0 (ICD-10-CM) - Bilateral primary osteoarthritis of knee  THERAPY DIAG:  Chronic pain of both knees  Muscle weakness (generalized)  Rationale for Evaluation and Treatment: Rehabilitation  ONSET DATE: 04/15/2023  SUBJECTIVE:   SUBJECTIVE STATEMENT: Having issues with her knees and the orthopedist referred her here to take a look at her knees. Has been getting cortisone shots and they have been going well. Is referred to start aquatic therapy. Notes knee pain has been going on for a long time and it is getting progressively worse. Has never tried PT for her knees before. Has a membership at [employer]. Currently walking for exercise. Standing or sitting for too long will make the knees worse or sitting in a car for >1-1.5 hrs and will start feeling twinges of pain and stiffness.   PERTINENT HISTORY: PMH: bilateral osteoarthritis of knees, HLD, vitamin D deficiency, Osteoporosis, hx of breast cancer, hx of back surgery (pt reports in the 1960s when she was in middle school for scoliosis) PAIN:  Are you having pain? No Just having pain when sitting or standing for longer periods of  time.   PRECAUTIONS: None  FALLS:  Has patient fallen in last 6 months? No  LIVING ENVIRONMENT: Lives with: lives with their spouse Lives in: [employer] Stairs: Yes: Internal: 3 levels, so 10 steps per level steps; on right going up, also has an elevator, but tries not to use it.  Has following equipment at home: None   PLOF: Independent  PATIENT GOALS: Wants to be more active, do walking without pain and walk 30 minutes without any significant pain, wants to prevent knee replacement surgery.   OBJECTIVE:  Note: Objective measures were completed at Evaluation unless otherwise noted.   PATIENT SURVEYS:  LEFS 40/80  COGNITION: Overall cognitive status: Within functional limits for tasks assessed     SENSATION: WFL  MUSCLE LENGTH: Hamstrings: Approx. 95-100 degrees bilaterally, pt with great hamstring flexibility    POSTURE: rounded shoulders and forward head   LOWER EXTREMITY ROM: Hip flexion WNL bilaterally, slightly limited in hip IR/ER  Knee flexion WNL, some pain at end range   LOWER EXTREMITY MMT:  MMT Right eval Left eval  Hip flexion 5 5  Hip extension 4 4  Hip abduction 4- 4-  Hip adduction 5 5  Hip internal rotation    Hip external rotation    Knee flexion 5 5  Knee extension 5 5  Ankle dorsiflexion 5 5  Ankle plantarflexion    Ankle inversion    Ankle eversion     (Blank rows =  not tested)   FUNCTIONAL TESTS:  30 seconds chair stand test: 17 sit <> stands with no UE support (<10 below average for age) 10 meter walk test: 8 seconds = 4.1 ft/sec   GAIT: Distance walked: Clinic distances  Assistive device utilized: None Level of assistance: Complete Independence  TODAY'S TREATMENT:                                                                                                                              N/A during eval   PATIENT EDUCATION:  Education details: Clinical findings, Aquatic therapy  Person educated: Patient Education  method: Chief Technology Officer Education comprehension: verbalized understanding  HOME EXERCISE PROGRAM: Will provide for aquatic therapy in future sessions   ASSESSMENT:  CLINICAL IMPRESSION: Patient is a 72 year old female referred to Neuro OPPT for bilateral knee OA.  Pt referred here by orthopedist for aquatic therapy. Pt gets cortisone shots which helps with her knee pain. Pt's PMH is significant for: bilateral osteoarthritis of knees, HLD, vitamin D deficiency, Osteoporosis, hx of breast cancer, hx of back surgery (pt reports in the 1960s when she was in middle school for scoliosis). The following deficits were present during the exam: decr hip ABD/extensor strength, knee pain with incr standing/walking/sitting. Pt would benefit from skilled PT to address these impairments and functional limitations to decr knee pain and improve strength. Pt is a member at [employer] and will benefit from an aquatic HEP that pt can continue to perform after D/C.    OBJECTIVE IMPAIRMENTS: decreased activity tolerance, decreased mobility, decreased strength, and pain.   ACTIVITY LIMITATIONS: locomotion level  PARTICIPATION LIMITATIONS: community activity  PERSONAL FACTORS: Age, Past/current experiences, Time since onset of injury/illness/exacerbation, and 3+ comorbidities: bilateral osteoarthritis of knees, HLD, vitamin D deficiency, Osteoporosis, hx of breast cancer, hx of back surgery (pt reports in the 1960s when she was in middle school for scoliosis)  are also affecting patient's functional outcome.   REHAB POTENTIAL: Good  CLINICAL DECISION MAKING: Stable/uncomplicated  EVALUATION COMPLEXITY: Low   GOALS: Goals reviewed with patient? Yes  SHORT TERM GOALS: ALL STGS = LTGS  LONG TERM GOALS: Target date: 07/19/2023   Pt will improve LEFS score to at least a 48/80 in order to demo improved function with knee pain.  Baseline: 40/80 Goal status: INITIAL  2.  Pt will be independent with  final aquatic HEP to continue at [facility] after D/C.  Baseline:  Goal status: INITIAL    PLAN:  PT FREQUENCY: 1x/week  PT DURATION: 8 weeks  PLANNED INTERVENTIONS: 97164- PT Re-evaluation, 97110-Therapeutic exercises, 97530- Therapeutic activity, 97112- Neuromuscular re-education, 97535- Self Care, 86578- Manual therapy, [phone]- Aquatic Therapy, and Patient/Family education  PLAN FOR NEXT SESSION: Aquatic PT for bilateral knee OA, hip extensor/ABD strengthening, pt is a member at [employer], so work on Audiological scientist HEP that pt can do afterwards    Drake Leach, PT, DPT 05/24/2023, 12:46 PM

## 2023-05-24 ENCOUNTER — Ambulatory Visit: Payer: PRIVATE HEALTH INSURANCE | Attending: Family Medicine | Admitting: Physical Therapy

## 2023-05-24 ENCOUNTER — Encounter: Payer: Self-pay | Admitting: Physical Therapy

## 2023-05-24 DIAGNOSIS — M6281 Muscle weakness (generalized): Secondary | ICD-10-CM | POA: Diagnosis present

## 2023-05-24 DIAGNOSIS — M25561 Pain in right knee: Secondary | ICD-10-CM | POA: Insufficient documentation

## 2023-05-24 DIAGNOSIS — G8929 Other chronic pain: Secondary | ICD-10-CM | POA: Insufficient documentation

## 2023-05-24 DIAGNOSIS — M25562 Pain in left knee: Secondary | ICD-10-CM | POA: Insufficient documentation

## 2023-05-24 NOTE — Patient Instructions (Signed)
  Aquatic Therapy: What to Expect!  Where:  [facility] at [address] [place], [place]  [address] [phone]  NOTE:  You will receive an automated phone message reminding you of your appointment and it will say the appointment is at the Rehab Center on [address]  We are working to fix this- just know that you will meet us at the pool!  How to Prepare: Please make sure you drink 8 ounces of water about one hour prior to your pool session A caregiver MUST attend the entire session with the patient.  The caregiver will be responsible for assisting with dressing as well as any toileting needs.  If the patient will be doing a home program this should likely be the person who will assist as well.  Patients must wear either their street shoes or pool shoes until they are ready to enter the pool with the therapist.  Patients must also wear either street shoes or pool shoes once exiting the pool to walk to the locker room.  This will helps us prevent slips and falls.  Please arrive 15 minutes early to prepare for your pool therapy session Sign in at the front desk on the clipboard marked for [facility] You may use the locker rooms on your right and then enter directly into the recreation pool (NOT the competition pool) Please make sure to attend to any toileting needs prior to entering the pool Please be dressed in your swim suit and on the pool deck at least 5 minutes before your appointment Once on the pool deck your therapist will ask you to sign the Patient  Consent and Assignment of Benefits form Your therapist may take your blood pressure prior to, during and after your session if indicated  About the pool  and parking: Entering the pool Your therapist will assist you; there are 2 ways to enter:  stairs with railings or with a chair lift.   Your therapist will determine the most appropriate way for you. Water temperature is usually between 86-87 degrees There  may be other swimmers in the pool at the same time Parking is free.   Contact Info:     Appointments: [facility]  All sessions are 45 minutes   [address]  [address]     Please call the [facility] if   [place], [place]   [address]    you need to cancel or reschedule an appointment.  [phone]       

## 2023-06-28 ENCOUNTER — Encounter: Payer: Self-pay | Admitting: Rehabilitation

## 2023-06-28 ENCOUNTER — Ambulatory Visit: Payer: Medicare Other | Admitting: Rehabilitation

## 2023-06-28 NOTE — Therapy (Deleted)
 OUTPATIENT PHYSICAL THERAPY LOWER EXTREMITY TREATMENT   Patient Name: Valerie Miller MRN: [id number] DOB:1950-07-29, 73 y.o., female Today's Date: 06/28/2023  END OF SESSION:  PT End of Session - 06/28/23 0717     Visit Number 2    Number of Visits 5    Date for PT Re-Evaluation 07/23/23   due to delay in scheduling   Authorization Type [employer]    Equipment Utilized During Treatment Other (comment)   floatation devices as needed for safety   Activity Tolerance Patient tolerated treatment well    Behavior During Therapy [facility] for tasks assessed/performed             History reviewed. No pertinent past medical history. History reviewed. No pertinent surgical history. There are no active problems to display for this patient.   PCP: Theo Iha, MD   REFERRING PROVIDER: Delane Lye, MD  REFERRING DIAG: M17.0 (ICD-10-CM) - Bilateral primary osteoarthritis of knee  THERAPY DIAG:  Chronic pain of both knees  Muscle weakness (generalized)  Rationale for Evaluation and Treatment: Rehabilitation  ONSET DATE: 04/15/2023  SUBJECTIVE:   SUBJECTIVE STATEMENT:   PERTINENT HISTORY: PMH: bilateral osteoarthritis of knees, HLD, vitamin D deficiency, Osteoporosis, hx of breast cancer, hx of back surgery (pt reports in the 1960s when she was in middle school for scoliosis) PAIN:  Are you having pain? No Just having pain when sitting or standing for longer periods of time.   PRECAUTIONS: None  FALLS:  Has patient fallen in last 6 months? No  LIVING ENVIRONMENT: Lives with: lives with their spouse Lives in: [employer] Stairs: Yes: Internal: 3 levels, so 10 steps per level steps; on right going up, also has an elevator, but tries not to use it.  Has following equipment at home: None   PLOF: Independent  PATIENT GOALS: Wants to be more active, do walking without pain and walk 30 minutes without any significant pain, wants to prevent knee replacement surgery.    OBJECTIVE:  Note: Objective measures were completed at Evaluation unless otherwise noted.   PATIENT SURVEYS:  LEFS 40/80  COGNITION: Overall cognitive status: Within functional limits for tasks assessed     SENSATION: WFL  MUSCLE LENGTH: Hamstrings: Approx. 95-100 degrees bilaterally, pt with great hamstring flexibility    POSTURE: rounded shoulders and forward head   LOWER EXTREMITY ROM: Hip flexion WNL bilaterally, slightly limited in hip IR/ER  Knee flexion WNL, some pain at end range   LOWER EXTREMITY MMT:  MMT Right eval Left eval  Hip flexion 5 5  Hip extension 4 4  Hip abduction 4- 4-  Hip adduction 5 5  Hip internal rotation    Hip external rotation    Knee flexion 5 5  Knee extension 5 5  Ankle dorsiflexion 5 5  Ankle plantarflexion    Ankle inversion    Ankle eversion     (Blank rows = not tested)   FUNCTIONAL TESTS:  30 seconds chair stand test: 17 sit <> stands with no UE support (<10 below average for age) 10 meter walk test: 8 seconds = 4.1 ft/sec   GAIT: Distance walked: Clinic distances  Assistive device utilized: None Level of assistance: Complete Independence  TODAY'S TREATMENT:  Patient seen for aquatic therapy today.  Treatment took place in water 3.6-4.0 feet deep depending upon activity.  Pt entered and exited the pool via stairs with rails as needed for support.  Pool temp approx 92 deg.    Warm Up:  Walking forwards x approx 18', backwards walking x 18' and side stepping x 18' without UE support.   Marching forwards with small barbells for support x 2 laps, then x another 2 laps adding in opposite arm motions.      Pt requires buoyancy of water for support for reduced fall risk and for unloading/reduced stress on joints (B knees) as pt able to tolerate increased standing and ambulation in water compared  to that on land; viscosity of water is needed for resistance for strengthening and current of water provides perturbations for challenge for balance training     PATIENT EDUCATION:  Education details: aquatic rationale Person educated: Patient Education method: Chief Technology Officer Education comprehension: verbalized understanding  HOME EXERCISE PROGRAM: Will provide for aquatic therapy in future sessions   ASSESSMENT:  CLINICAL IMPRESSION: Pt presents for first pool session at [facility].  Tolerated all exercises well with minimal pain.      OBJECTIVE IMPAIRMENTS: decreased activity tolerance, decreased mobility, decreased strength, and pain.   ACTIVITY LIMITATIONS: locomotion level  PARTICIPATION LIMITATIONS: community activity  PERSONAL FACTORS: Age, Past/current experiences, Time since onset of injury/illness/exacerbation, and 3+ comorbidities: bilateral osteoarthritis of knees, HLD, vitamin D deficiency, Osteoporosis, hx of breast cancer, hx of back surgery (pt reports in the 1960s when she was in middle school for scoliosis)  are also affecting patient's functional outcome.   REHAB POTENTIAL: Good  CLINICAL DECISION MAKING: Stable/uncomplicated  EVALUATION COMPLEXITY: Low   GOALS: Goals reviewed with patient? Yes  SHORT TERM GOALS: ALL STGS = LTGS  LONG TERM GOALS: Target date: 07/19/2023   Pt will improve LEFS score to at least a 48/80 in order to demo improved function with knee pain.  Baseline: 40/80 Goal status: INITIAL  2.  Pt will be independent with final aquatic HEP to continue at [facility] after D/C.  Baseline:  Goal status: INITIAL    PLAN:  PT FREQUENCY: 1x/week  PT DURATION: 8 weeks  PLANNED INTERVENTIONS: 97164- PT Re-evaluation, 97110-Therapeutic exercises, 97530- Therapeutic activity, 97112- Neuromuscular re-education, 97535- Self Care, 02859- Manual therapy, [phone]- Aquatic Therapy, and Patient/Family education  PLAN FOR NEXT SESSION:  Aquatic PT for bilateral knee OA, hip extensor/ABD strengthening, pt is a member at [facility], so work on audiological scientist HEP that pt can do afterwards   Damien Fought, PT, MPT [facility] [address] [place], [place], [address] Phone: [phone]   Fax:  [phone] 06/28/23, 7:18 AM

## 2023-07-05 ENCOUNTER — Ambulatory Visit: Payer: Medicare Other | Attending: Family Medicine | Admitting: Rehabilitation

## 2023-07-05 DIAGNOSIS — M25561 Pain in right knee: Secondary | ICD-10-CM | POA: Diagnosis present

## 2023-07-05 DIAGNOSIS — G8929 Other chronic pain: Secondary | ICD-10-CM | POA: Insufficient documentation

## 2023-07-05 DIAGNOSIS — M25562 Pain in left knee: Secondary | ICD-10-CM | POA: Insufficient documentation

## 2023-07-05 DIAGNOSIS — M6281 Muscle weakness (generalized): Secondary | ICD-10-CM | POA: Diagnosis present

## 2023-07-05 NOTE — Therapy (Signed)
 OUTPATIENT PHYSICAL THERAPY LOWER EXTREMITY EVALUATION   Patient Name: Valerie Miller MRN: [id number] DOB:[birth year], 73 y.o., female Today's Date: 07/05/2023  END OF SESSION:  PT End of Session - 07/05/23 9167     Visit Number 2   corrected from last entry   Number of Visits 5    Date for PT Re-Evaluation 07/23/23   due to delay in scheduling   Authorization Type [employer]    PT Start Time 0845    PT Stop Time 0930    PT Time Calculation (min) 45 min    Equipment Utilized During Treatment Other (comment)   floatation devices as needed for safety   Activity Tolerance Patient tolerated treatment well    Behavior During Therapy [facility] for tasks assessed/performed             No past medical history on file. No past surgical history on file. There are no active problems to display for this patient.   PCP: Theo Iha, MD   REFERRING PROVIDER: Delane Lye, MD  REFERRING DIAG: M17.0 (ICD-10-CM) - Bilateral primary osteoarthritis of knee  THERAPY DIAG:  Chronic pain of both knees  Muscle weakness (generalized)  Rationale for Evaluation and Treatment: Rehabilitation  ONSET DATE: 04/15/2023  SUBJECTIVE:   SUBJECTIVE STATEMENT: Pt reports 10/10 pain walking back to pool area but pain is 5/10 once in pool.    PERTINENT HISTORY: PMH: bilateral osteoarthritis of knees, HLD, vitamin D deficiency, Osteoporosis, hx of breast cancer, hx of back surgery (pt reports in the 1960s when she was in middle school for scoliosis) PAIN:  Are you having pain? No Just having pain when sitting or standing for longer periods of time.   PRECAUTIONS: None  FALLS:  Has patient fallen in last 6 months? No  LIVING ENVIRONMENT: Lives with: lives with their spouse Lives in: [employer] Stairs: Yes: Internal: 3 levels, so 10 steps per level steps; on right going up, also has an elevator, but tries not to use it.  Has following equipment at home: None   PLOF:  Independent  PATIENT GOALS: Wants to be more active, do walking without pain and walk 30 minutes without any significant pain, wants to prevent knee replacement surgery.   OBJECTIVE:  Note: Objective measures were completed at Evaluation unless otherwise noted.   PATIENT SURVEYS:  LEFS 40/80  COGNITION: Overall cognitive status: Within functional limits for tasks assessed     SENSATION: WFL  MUSCLE LENGTH: Hamstrings: Approx. 95-100 degrees bilaterally, pt with great hamstring flexibility    POSTURE: rounded shoulders and forward head   LOWER EXTREMITY ROM: Hip flexion WNL bilaterally, slightly limited in hip IR/ER  Knee flexion WNL, some pain at end range   LOWER EXTREMITY MMT:  MMT Right eval Left eval  Hip flexion 5 5  Hip extension 4 4  Hip abduction 4- 4-  Hip adduction 5 5  Hip internal rotation    Hip external rotation    Knee flexion 5 5  Knee extension 5 5  Ankle dorsiflexion 5 5  Ankle plantarflexion    Ankle inversion    Ankle eversion     (Blank rows = not tested)   FUNCTIONAL TESTS:  30 seconds chair stand test: 17 sit <> stands with no UE support (<10 below average for age) 10 meter walk test: 8 seconds = 4.1 ft/sec   GAIT: Distance walked: Clinic distances  Assistive device utilized: None Level of assistance: Complete Independence  TODAY'S TREATMENT:  Patient seen for aquatic therapy today.  Treatment took place in water 3.6-4.0 feet deep depending upon activity.  Pt entered and exited the pool via stairs using rail as needed for support.  Pool temp approx 92 deg.    Warm up:   Walking forwards x approx 18' x 4 laps, backwards walking x 4 laps, and side stepping x 4 laps (2 in each direction) without external support needed.  Min cues for upright posture.   Forward marching x 2 laps holding smaller barbells for  support again with cues for posture then added in opposite arm motions x 2 more laps again with cues for posture and slower motion.  Standing hip flex and ext (with knee ext) x 10 reps each side with stop in between reps holding pool noodle initially but due to poor balance and posture compensations had her move to wall for more support and she was able to perform without stopping in the middle and could increase speed for more strengthening.  Standing hip abd facing wall for support x 10 reps each side.  Sit<>stands from pool bench with feet on small step x 10 reps without UE support with cues for upright posture and increased hip extension at end of range.  Forward step ups with opposite LE march x 10 reps with single UE support on wall.  PT providing tactile facilitation at chest and pelvis for more hip extension when in full stance/upright posture.  Lateral step ups x 10 reps each side with light support from wall.    To continue with hip strengthening:  Performed wall bumps first without support and note continuous LOB therefore added smaller barbells for support, keeping them extended in front of her x 20 reps total, moving feet slightly further from wall halfway through.  Tandem gait forwards and backwards x 2 laps with small barbells for support.  Braiding x 2 laps (1 in each direction) with light support from PT but mostly without support.  Ended with wall kicks x 3 sets of 10 reps with PT holding lower pelvis for support to maintain horizontal alignment.    Educated pt on purpose of aquatics and that since she is a member at [facility] already, she has access to all the equipment we used in our session.  Also discussed that I would have her a HEP printed/laminated at last session to ensure carryover following DC.     Pt requires buoyancy of water for support for reduced fall risk and for unloading/reduced stress on joints (B knees) as pt able to tolerate increased standing and ambulation in water  compared to that on land; viscosity of water is needed for resistance for strengthening and current of water provides perturbations for challenge for balance training    PATIENT EDUCATION:  Education details: aquatic rationale  Person educated: Patient Education method: Chief Technology Officer Education comprehension: verbalized understanding  HOME EXERCISE PROGRAM: Will provide for aquatic therapy in future sessions   ASSESSMENT:  CLINICAL IMPRESSION: Patient presents for first visit at [facility] for pool session.  Pt with high amount of pain walking back to pool area but has significant pain relief once in pool and tolerated all exercises without increase in pain.  Educated on purpose of pool therapy and goals of strengthening and pain reduction.  Pt verbalized understanding.     OBJECTIVE IMPAIRMENTS: decreased activity tolerance, decreased mobility, decreased strength, and pain.   ACTIVITY LIMITATIONS: locomotion level  PARTICIPATION LIMITATIONS: community activity  PERSONAL FACTORS: Age, Past/current experiences,  Time since onset of injury/illness/exacerbation, and 3+ comorbidities: bilateral osteoarthritis of knees, HLD, vitamin D deficiency, Osteoporosis, hx of breast cancer, hx of back surgery (pt reports in the 1960s when she was in middle school for scoliosis)  are also affecting patient's functional outcome.   REHAB POTENTIAL: Good  CLINICAL DECISION MAKING: Stable/uncomplicated  EVALUATION COMPLEXITY: Low   GOALS: Goals reviewed with patient? Yes  SHORT TERM GOALS: ALL STGS = LTGS  LONG TERM GOALS: Target date: 07/19/2023   Pt will improve LEFS score to at least a 48/80 in order to demo improved function with knee pain.  Baseline: 40/80 Goal status: INITIAL  2.  Pt will be independent with final aquatic HEP to continue at [facility] after D/C.  Baseline:  Goal status: INITIAL    PLAN:  PT FREQUENCY: 1x/week  PT DURATION: 8 weeks  PLANNED  INTERVENTIONS: 97164- PT Re-evaluation, 97110-Therapeutic exercises, 97530- Therapeutic activity, 97112- Neuromuscular re-education, 97535- Self Care, 02859- Manual therapy, [phone]- Aquatic Therapy, and Patient/Family education  PLAN FOR NEXT SESSION: Aquatic PT for bilateral knee OA, hip extensor/ABD strengthening, pt is a member at [facility], so work on audiological scientist HEP that pt can do afterwards   Damien Fought, PT, MPT [facility] [address] [place], [place], [address] Phone: [phone]   Fax:  [phone] 07/05/23, 9:43 AM

## 2023-07-12 ENCOUNTER — Encounter: Payer: Self-pay | Admitting: Rehabilitation

## 2023-07-12 ENCOUNTER — Ambulatory Visit: Payer: Medicare Other | Admitting: Rehabilitation

## 2023-07-12 DIAGNOSIS — M25561 Pain in right knee: Secondary | ICD-10-CM | POA: Diagnosis not present

## 2023-07-12 DIAGNOSIS — G8929 Other chronic pain: Secondary | ICD-10-CM

## 2023-07-12 DIAGNOSIS — M6281 Muscle weakness (generalized): Secondary | ICD-10-CM

## 2023-07-12 NOTE — Therapy (Signed)
OUTPATIENT PHYSICAL THERAPY LOWER EXTREMITY EVALUATION   Patient Name: Valerie Miller MRN: [id number] DOB:12/03/1950, 73 y.o., female Today's Date: 07/12/2023  END OF SESSION:  PT End of Session - 07/12/23 0853     Visit Number 3    Number of Visits 5    Date for PT Re-Evaluation 07/23/23   due to delay in scheduling   Authorization Type [employer]    PT Start Time [phone]    PT Stop Time 0931    PT Time Calculation (min) 45 min    Equipment Utilized During Treatment Other (comment)   floatation devices as needed for safety   Activity Tolerance Patient tolerated treatment well    Behavior During Therapy [facility] for tasks assessed/performed             History reviewed. No pertinent past medical history. History reviewed. No pertinent surgical history. There are no active problems to display for this patient.   PCP: Andi Devon, MD   REFERRING PROVIDER: Arlyce Harman, MD  REFERRING DIAG: M17.0 (ICD-10-CM) - Bilateral primary osteoarthritis of knee  THERAPY DIAG:  Chronic pain of both knees  Muscle weakness (generalized)  Rationale for Evaluation and Treatment: Rehabilitation  ONSET DATE: 04/15/2023  SUBJECTIVE:   SUBJECTIVE STATEMENT: Pt doing well today, only 5/10 walking from outside.  Does endorse soreness last week on Wednesday but felt "good" about it.  Ready to do more today.   PERTINENT HISTORY: PMH: bilateral osteoarthritis of knees, HLD, vitamin D deficiency, Osteoporosis, hx of breast cancer, hx of back surgery (pt reports in the 1960s when she was in middle school for scoliosis) PAIN:  Are you having pain? 5/10 in B knees (L>R) Just having pain when sitting or standing for longer periods of time.   PRECAUTIONS: None  FALLS:  Has patient fallen in last 6 months? No  LIVING ENVIRONMENT: Lives with: lives with their spouse Lives in: [employer] Stairs: Yes: Internal: 3 levels, so 10 steps per level steps; on right going up, also has an elevator,  but tries not to use it.  Has following equipment at home: None   PLOF: Independent  PATIENT GOALS: Wants to be more active, do walking without pain and walk 30 minutes without any significant pain, wants to prevent knee replacement surgery.   OBJECTIVE:  Note: Objective measures were completed at Evaluation unless otherwise noted.   PATIENT SURVEYS:  LEFS 40/80  COGNITION: Overall cognitive status: Within functional limits for tasks assessed     SENSATION: WFL  MUSCLE LENGTH: Hamstrings: Approx. 95-100 degrees bilaterally, pt with great hamstring flexibility    POSTURE: rounded shoulders and forward head   LOWER EXTREMITY ROM: Hip flexion WNL bilaterally, slightly limited in hip IR/ER  Knee flexion WNL, some pain at end range   LOWER EXTREMITY MMT:  MMT Right eval Left eval  Hip flexion 5 5  Hip extension 4 4  Hip abduction 4- 4-  Hip adduction 5 5  Hip internal rotation    Hip external rotation    Knee flexion 5 5  Knee extension 5 5  Ankle dorsiflexion 5 5  Ankle plantarflexion    Ankle inversion    Ankle eversion     (Blank rows = not tested)   FUNCTIONAL TESTS:  30 seconds chair stand test: 17 sit <> stands with no UE support (<10 below average for age) 10 meter walk test: 8 seconds = 4.1 ft/sec   GAIT: Distance walked: Clinic distances  Assistive device utilized: None Level  of assistance: Complete Independence  TODAY'S TREATMENT:                                                                                                                              Patient seen for aquatic therapy today.  Treatment took place in water 3.6-4.0 feet deep depending upon activity.  Pt entered and exited the pool via stairs using rail as needed for support.  Pool temp approx 92 deg.    Warm up:   Walking forwards x approx 18' x 4 laps, backwards walking x 4 laps, and side stepping x 4 laps (2 in each direction) without external support needed.  Min cues for  upright posture.   For side stepping had her hold larger yellow barbells by side for increased resistance and core activation.    Forward marching x 1 laps holding smaller barbells for support again with cues for posture then added in opposite arm motions x 3 more laps again with cues for posture and slower motion.  Donned 3.5lb ankle weights bilaterally prior to next set of standing exercises:  Standing hip flex>knee ext with return to midline x 10 reps on each side,  Sit<>stands from 3rd step with feet on first step.  Max cues for maintaining forward weight shift to keep weight on feet.  Pt initially needing support for standing but improved with repetition.  Forward step ups with opposite LE march x 10 reps with single UE support on wall.  Continued cues for ensuring she doesn't lock knees out and that she shifts weight forward with stance/march.  Standing hip circles with light support on wall x 10 reps clockwise and 10 reps counterclockwise.  Min cues for maintaining upright posture.     Ai chi postures for core strengthening and balance:  "Enclosing" x 20 reps, "Soothing" x 20 reps with min cues for maintaining squat and taking intermittent breaks when needed.    Attempted sitting on noodle in "saddle" sit at end of session.  Pt given barbells for UE support and cuing however had many LOB needing to put feet back to floor.  Had her come to wall and hold wall where she was able to maintaining balance somewhat better but will attempt again at next session.  Wall kicks x 3 sets of 4 reps.  Had pt perform on her own today and was only able to maintain horizontal for 4 reps on each side before returning to standing.      Will have aquatic HEP at next session and go over each exercise.  Pt verbalized understanding.      Pt requires buoyancy of water for support for reduced fall risk and for unloading/reduced stress on joints (B knees) as pt able to tolerate increased standing and ambulation in water  compared to that on land; viscosity of water is needed for resistance for strengthening and current of water provides perturbations for challenge for balance training    PATIENT  EDUCATION:  Education details: aquatic rationale  Person educated: Patient Education method: [employer] Education comprehension: verbalized understanding  HOME EXERCISE PROGRAM: Will provide for aquatic therapy in future sessions   ASSESSMENT:  CLINICAL IMPRESSION: Patient able to add ankle weights to LE strengthening exercises today.  We did try to mix core and balance to allow LEs to rest in between leg exercises which she tolerated well.  Will plan to have HEP at next session for D/C.     OBJECTIVE IMPAIRMENTS: decreased activity tolerance, decreased mobility, decreased strength, and pain.   ACTIVITY LIMITATIONS: locomotion level  PARTICIPATION LIMITATIONS: community activity  PERSONAL FACTORS: Age, Past/current experiences, Time since onset of injury/illness/exacerbation, and 3+ comorbidities: bilateral osteoarthritis of knees, HLD, vitamin D deficiency, Osteoporosis, hx of breast cancer, hx of back surgery (pt reports in the 1960s when she was in middle school for scoliosis)  are also affecting patient's functional outcome.   REHAB POTENTIAL: Good  CLINICAL DECISION MAKING: Stable/uncomplicated  EVALUATION COMPLEXITY: Low   GOALS: Goals reviewed with patient? Yes  SHORT TERM GOALS: ALL STGS = LTGS  LONG TERM GOALS: Target date: 07/19/2023   Pt will improve LEFS score to at least a 48/80 in order to demo improved function with knee pain.  Baseline: 40/80 Goal status: INITIAL  2.  Pt will be independent with final aquatic HEP to continue at [facility] after D/C.  Baseline:  Goal status: MET     PLAN:  PT FREQUENCY: 1x/week  PT DURATION: 8 weeks  PLANNED INTERVENTIONS: 97164- PT Re-evaluation, 97110-Therapeutic exercises, 97530- Therapeutic activity, 97112- Neuromuscular  re-education, 97535- Self Care, 40981- Manual therapy, [phone]- Aquatic Therapy, and Patient/Family education  PLAN FOR NEXT SESSION: Aquatic PT for bilateral knee OA, hip extensor/ABD strengthening, pt is a member at [employer], so work on Audiological scientist HEP that pt can do afterwards   Harriet Butte, PT, MPT [facility] [address] [place], [place], [address] Phone: [phone]   Fax:  [phone] 07/12/23, 9:36 AM

## 2023-07-19 ENCOUNTER — Encounter: Payer: Self-pay | Admitting: Rehabilitation

## 2023-07-19 ENCOUNTER — Ambulatory Visit: Payer: Medicare Other | Admitting: Rehabilitation

## 2023-07-19 DIAGNOSIS — G8929 Other chronic pain: Secondary | ICD-10-CM

## 2023-07-19 DIAGNOSIS — M6281 Muscle weakness (generalized): Secondary | ICD-10-CM

## 2023-07-19 DIAGNOSIS — M25561 Pain in right knee: Secondary | ICD-10-CM | POA: Diagnosis not present

## 2023-07-19 NOTE — Therapy (Signed)
OUTPATIENT PHYSICAL THERAPY LOWER EXTREMITY EVALUATION AND DC SUMMARY    Patient Name: Valerie Miller MRN: [id number] DOB:11-25-1950, 73 y.o., female Today's Date: 07/19/2023  END OF SESSION:  PT End of Session - 07/19/23 0809     Visit Number 4    Number of Visits 5    Date for PT Re-Evaluation 07/23/23   due to delay in scheduling   Authorization Type [employer]    PT Start Time 1020    PT Stop Time 1100    PT Time Calculation (min) 40 min    Equipment Utilized During Treatment Other (comment)   floatation devices as needed for safety   Activity Tolerance Patient tolerated treatment well    Behavior During Therapy [facility] for tasks assessed/performed             History reviewed. No pertinent past medical history. History reviewed. No pertinent surgical history. There are no active problems to display for this patient.   PCP: Andi Devon, MD   REFERRING PROVIDER: Arlyce Harman, MD  REFERRING DIAG: M17.0 (ICD-10-CM) - Bilateral primary osteoarthritis of knee  THERAPY DIAG:  Chronic pain of both knees  Muscle weakness (generalized)  Rationale for Evaluation and Treatment: Rehabilitation  ONSET DATE: 04/15/2023  SUBJECTIVE:   SUBJECTIVE STATEMENT: Pt doing well this morning, knees are 6/10 pain today.   PERTINENT HISTORY: PMH: bilateral osteoarthritis of knees, HLD, vitamin D deficiency, Osteoporosis, hx of breast cancer, hx of back surgery (pt reports in the 1960s when she was in middle school for scoliosis) PAIN:  Are you having pain? 6/10 in B knees (L>R) Just having pain when sitting or standing for longer periods of time.   PRECAUTIONS: None  FALLS:  Has patient fallen in last 6 months? No  LIVING ENVIRONMENT: Lives with: lives with their spouse Lives in: [employer] Stairs: Yes: Internal: 3 levels, so 10 steps per level steps; on right going up, also has an elevator, but tries not to use it.  Has following equipment at home: None   PLOF:  Independent  PATIENT GOALS: Wants to be more active, do walking without pain and walk 30 minutes without any significant pain, wants to prevent knee replacement surgery.   OBJECTIVE:  Note: Objective measures were completed at Evaluation unless otherwise noted.   PATIENT SURVEYS:  LEFS 40/80  COGNITION: Overall cognitive status: Within functional limits for tasks assessed     SENSATION: WFL  MUSCLE LENGTH: Hamstrings: Approx. 95-100 degrees bilaterally, pt with great hamstring flexibility    POSTURE: rounded shoulders and forward head   LOWER EXTREMITY ROM: Hip flexion WNL bilaterally, slightly limited in hip IR/ER  Knee flexion WNL, some pain at end range   LOWER EXTREMITY MMT:  MMT Right eval Left eval  Hip flexion 5 5  Hip extension 4 4  Hip abduction 4- 4-  Hip adduction 5 5  Hip internal rotation    Hip external rotation    Knee flexion 5 5  Knee extension 5 5  Ankle dorsiflexion 5 5  Ankle plantarflexion    Ankle inversion    Ankle eversion     (Blank rows = not tested)   FUNCTIONAL TESTS:  30 seconds chair stand test: 17 sit <> stands with no UE support (<10 below average for age) 10 meter walk test: 8 seconds = 4.1 ft/sec   GAIT: Distance walked: Clinic distances  Assistive device utilized: None Level of assistance: Complete Independence  TODAY'S TREATMENT:  Patient seen for aquatic therapy today.  Treatment took place in water 3.6-4.0 feet deep depending upon activity.  Pt entered and exited the pool via stairs using rail as needed for support.  Pool temp approx 92 deg.    Access Code: [email or web address] URL: [email or web address] Date: 07/19/2023 Prepared by: Harriet Butte  Exercises - Forward and Backward Stepping at [facility]  - 1 x daily - 7 x weekly - 1 sets - 4 reps - Lateral Stepping at [address] [facility] Wall  - 1 x  daily - 7 x weekly - 1 sets - 4 reps - Forward March with Opposite Arm Knee Taps and Hand Floats  - 1 x daily - 7 x weekly - 1 sets - 4 reps - Standing Hip Flexion Extension at [employer]  - 1 x daily - 7 x weekly - 1 sets - 10 reps - Standing Hip Abduction Adduction at [employer]  - 1 x daily - 7 x weekly - 1 sets - 10 reps - Standing Hip Circles at [employer]  - 1 x daily - 7 x weekly - 1 sets - 10 reps - Flutter Kick at [employer]  - 1 x daily - 7 x weekly - 3 sets - 10 reps - Sit to Stand Without Arm Support  - 1 x daily - 7 x weekly - 1 sets - 10 reps - Runner's Step Up/Down  - 1 x daily - 7 x weekly - 1 sets - 10 reps  Also provided Ai Chi postures:  "Enclosing", "Soothing" and "Accepting" x 10 reps each.  Provided handout for these as well.     Pt requires buoyancy of water for support for reduced fall risk and for unloading/reduced stress on joints (B knees) as pt able to tolerate increased standing and ambulation in water compared to that on land; viscosity of water is needed for resistance for strengthening and current of water provides perturbations for challenge for balance training    PATIENT EDUCATION:  Education details: aquatic rationale and HEP  Person educated: Patient Education method: Chief Technology Officer Education comprehension: verbalized understanding  HOME EXERCISE PROGRAM: Will provide for aquatic therapy in future sessions    PHYSICAL THERAPY DISCHARGE SUMMARY  Visits from Start of Care: 4  Current functional level related to goals / functional outcomes: SEE GOALS BELOW    Remaining deficits: No deficits, has HEP to address LE strengthening and pain reduction    Education / Equipment: HEP    Patient agrees to discharge. Patient goals were met. Patient is being discharged due to meeting the stated rehab goals.    ASSESSMENT:  CLINICAL IMPRESSION: PT provided HEP for todays session in which we went over during session and she feels that she can  perform on her own when she goes to [place] on her own.  Ready for DC.     OBJECTIVE IMPAIRMENTS: decreased activity tolerance, decreased mobility, decreased strength, and pain.   ACTIVITY LIMITATIONS: locomotion level  PARTICIPATION LIMITATIONS: community activity  PERSONAL FACTORS: Age, Past/current experiences, Time since onset of injury/illness/exacerbation, and 3+ comorbidities: bilateral osteoarthritis of knees, HLD, vitamin D deficiency, Osteoporosis, hx of breast cancer, hx of back surgery (pt reports in the 1960s when she was in middle school for scoliosis)  are also affecting patient's functional outcome.   REHAB POTENTIAL: Good  CLINICAL DECISION MAKING: Stable/uncomplicated  EVALUATION COMPLEXITY: Low   GOALS: Goals reviewed with patient? Yes  SHORT TERM GOALS: ALL STGS = LTGS  LONG TERM GOALS:  Target date: 07/19/2023   Pt will improve LEFS score to at least a 48/80 in order to demo improved function with knee pain.  Baseline: 40/80 Goal status: INITIAL  2.  Pt will be independent with final aquatic HEP to continue at [facility] after D/C.  Baseline:  Goal status: MET     PLAN:  PT FREQUENCY: 1x/week  PT DURATION: 8 weeks  PLANNED INTERVENTIONS: 97164- PT Re-evaluation, 97110-Therapeutic exercises, 97530- Therapeutic activity, 97112- Neuromuscular re-education, 97535- Self Care, 57846- Manual therapy, [phone]- Aquatic Therapy, and Patient/Family education  PLAN FOR NEXT SESSION: Aquatic PT for bilateral knee OA, hip extensor/ABD strengthening, pt is a member at [employer], so work on Audiological scientist HEP that pt can do afterwards   Harriet Butte, PT, MPT [facility] [address] [place], [place], [address] Phone: [phone]   Fax:  [phone] 07/19/23, 11:52 AM

## 2023-12-21 ENCOUNTER — Telehealth (HOSPITAL_COMMUNITY): Payer: Self-pay | Admitting: Gastroenterology

## 2023-12-21 NOTE — Telephone Encounter (Signed)
 12/20/23: sent what-to-order packet to [facility] for Dr. Camellia Hummer to advise which swallow study was needed. 12/21/23: received call from his office advising he wanted to order an ESO test. Gave them the number for centralized scheduling as they handle ESO scheduling. (AHARRIS)

## 2023-12-27 ENCOUNTER — Encounter (HOSPITAL_BASED_OUTPATIENT_CLINIC_OR_DEPARTMENT_OTHER): Payer: Self-pay | Admitting: Neurological Surgery

## 2023-12-28 ENCOUNTER — Other Ambulatory Visit (HOSPITAL_COMMUNITY): Payer: Self-pay | Admitting: Gastroenterology

## 2023-12-28 DIAGNOSIS — R1310 Dysphagia, unspecified: Secondary | ICD-10-CM

## 2023-12-30 ENCOUNTER — Ambulatory Visit (HOSPITAL_COMMUNITY)
Admission: RE | Admit: 2023-12-30 | Discharge: 2023-12-30 | Disposition: A | Discharge status: Home / Self Care | Source: Ambulatory Visit | Attending: Gastroenterology

## 2023-12-30 DIAGNOSIS — R1310 Dysphagia, unspecified: Secondary | ICD-10-CM | POA: Diagnosis present
